# Patient Record
Sex: Female | Born: 1940 | Race: White | Hispanic: No | Marital: Married | Smoking: Former smoker
Health system: Southern US, Community
[De-identification: ages and names within clinical notes are randomized; demographics above are authoritative.]

---

## 2013-07-24 ENCOUNTER — Ambulatory Visit: Payer: Self-pay | Admitting: Family Medicine

## 2014-05-31 ENCOUNTER — Ambulatory Visit: Payer: Self-pay | Admitting: Family Medicine

## 2014-08-07 ENCOUNTER — Ambulatory Visit: Payer: Self-pay | Admitting: Family Medicine

## 2014-09-23 ENCOUNTER — Telehealth: Payer: Self-pay | Admitting: Physical Therapy

## 2014-09-23 ENCOUNTER — Ambulatory Visit: Payer: Medicare Other | Attending: Family Medicine | Admitting: Physical Therapy

## 2014-09-23 DIAGNOSIS — Z9181 History of falling: Secondary | ICD-10-CM | POA: Insufficient documentation

## 2014-09-23 DIAGNOSIS — R2689 Other abnormalities of gait and mobility: Secondary | ICD-10-CM | POA: Insufficient documentation

## 2014-09-23 DIAGNOSIS — R296 Repeated falls: Secondary | ICD-10-CM

## 2014-09-23 NOTE — Therapy (Signed)
[place] MAIN [facility] SERVICES [address] [place], [place], [address] Phone: [phone]   Fax:  [phone]  Physical Therapy Evaluation  Patient Details  Name: April Rivera MRN: [id number] Date of Birth: 1941/04/09 Referring Provider:  No ref. provider found  Encounter Date: 09/23/2014      PT End of Session - 09/23/14 1237    Visit Number 1   Number of Visits 16   Date for PT Re-Evaluation 11/26/14   PT Start Time 1100   PT Stop Time 1210   PT Time Calculation (min) 70 min   Equipment Utilized During Treatment Gait belt   Activity Tolerance Patient tolerated treatment well;Patient limited by fatigue   Behavior During Therapy WFL for tasks assessed/performed      No past medical history on file.  No past surgical history on file.  There were no vitals filed for this visit.  Visit Diagnosis:  Multiple falls  Imbalance      Subjective Assessment - 09/23/14 1111    Subjective Patient reports a fall in January resulting in an L3 fracture and retroperitoneal bleed (she was hospitalized for 2 weeks). 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 She has been ambulating at home without an AD, but feels weak and has had multiple falls in the past year.    Patient is accompained by: Family member   Limitations Walking;Standing;Other (comment)  Trouble getting out of low chairs.    How long can you sit comfortably? Patient has pain all the time in her legs, which has been increasing in her feet recently.    Patient Stated Goals Patient would like to stop falling, ambulate further distances, decrease her pain, and increase her LE strength.    Currently in Pain? Yes   Pain Score 5    Pain Location Leg   Pain Orientation Left;Right   Pain Descriptors / Indicators Aching   Pain Type Chronic pain   Pain Radiating Towards Has worked it's way down the leg, and is  now concentrated in the feet.    Pain Onset More than a month ago   Pain Frequency Constant   Aggravating Factors  Increased mobility.    Pain Relieving Factors Tylenol, heating pad for the legs.    Effect of Pain on Daily Activities Decreased her ability to be independent in the community.             [facility] PT Assessment - 09/23/14 0001    Assessment   Medical Diagnosis History of falls   Precautions   Precautions ICD/Pacemaker   Restrictions   Weight Bearing Restrictions No   Balance Screen   Has the patient fallen in the past 6 months Yes   How many times? 5   Has the patient had a decrease in activity level because of a fear of falling?  Yes   Is the patient reluctant to leave their home because of a fear of falling?  Yes   Home Environment   Living Enviornment Private residence   Living Arrangements Spouse/significant other   Available Help at Discharge Family   Type of Home House   Home Access Stairs to enter   Entrance Stairs-Number of Steps 3   Entrance Stairs-Rails Can reach both   Home Layout One level   Home Equipment Walker - 2 wheels   Sensation   Light Touch Appears Intact   Sit to Stand  Comments --  5x sit to stand in 52.5 seconds   ROM / Strength   AROM / PROM / Strength Strength   Strength   Overall Strength Deficits   Overall Strength Comments LE hip abductor 4/5, hip flexors 4-/5, knee extensors and flexors 5/5   Palpation   Palpation --  Tender to palpation around plantar surface of calcaneus   Ambulation/Gait   Ambulation/Gait Assistance 5: Supervision   Assistive device None   Ambulation Surface Level   Gait velocity 0.52 m/s   Stairs Yes   Stairs Assistance 5: Supervision   Stairs Assistance Details (indicate cue type and reason) Rails and step over step   Number of Stairs 4   Berg Balance Test   Sit to Stand Able to stand  independently using hands   Standing Unsupported Able to stand safely 2 minutes   Sitting with Back Unsupported  but Feet Supported on Floor or Stool Able to sit safely and securely 2 minutes   Stand to Sit Controls descent by using hands   Transfers Able to transfer safely, definite need of hands   Standing Unsupported with Eyes Closed Able to stand 10 seconds with supervision   Standing Ubsupported with Feet Together Able to place feet together independently and stand for 1 minute with supervision   From Standing, Reach Forward with Outstretched Arm Can reach forward >12 cm safely (5")   From Standing Position, Pick up Object from Floor Able to pick up shoe, needs supervision   From Standing Position, Turn to Look Behind Over each Shoulder Looks behind one side only/other side shows less weight shift   Turn 360 Degrees Able to turn 360 degrees safely but slowly   Standing Unsupported, Alternately Place Feet on Step/Stool Able to stand independently and safely and complete 8 steps in 20 seconds   Standing Unsupported, One Foot in [employer] Loses balance while stepping or standing   Standing on One Leg Tries to lift leg/unable to hold 3 seconds but remains standing independently   Total Score 39   Timed Up and Go Test   Normal TUG (seconds) 28.6   Functional Gait  Assessment   Gait Level Surface Walks 20 ft in less than 7 sec but greater than 5.5 sec, uses assistive device, slower speed, mild gait deviations, or deviates 6-10 in outside of the 12 in walkway width.   Change in Gait Speed Able to change speed, demonstrates mild gait deviations, deviates 6-10 in outside of the 12 in walkway width, or no gait deviations, unable to achieve a major change in velocity, or uses a change in velocity, or uses an assistive device.   Gait with Horizontal Head Turns Performs head turns smoothly with slight change in gait velocity (eg, minor disruption to smooth gait path), deviates 6-10 in outside 12 in walkway width, or uses an assistive device.   Gait with Vertical Head Turns Performs task with moderate change in gait  velocity, slows down, deviates 10-15 in outside 12 in walkway width but recovers, can continue to walk.   Gait and Pivot Turn Turns slowly, requires verbal cueing, or requires several small steps to catch balance following turn and stop   Step Over Obstacle Is able to step over one shoe box (4.5 in total height) but must slow down and adjust steps to clear box safely. May require verbal cueing.   Gait with Narrow Base of Support Ambulates less than 4 steps heel to toe or cannot perform without assistance.  Gait with Eyes Closed Walks 20 ft, slow speed, abnormal gait pattern, evidence for imbalance, deviates 10-15 in outside 12 in walkway width. Requires more than 9 sec to ambulate 20 ft.   Ambulating Backwards Walks 20 ft, slow speed, abnormal gait pattern, evidence for imbalance, deviates 10-15 in outside 12 in walkway width.   Steps Alternating feet, must use rail.   Total Score 13                           PT Education - 09/23/14 1236    Education provided Yes   Education Details Home exercise program, educated patient on need to use a walker when not supervised in the home for now, educated pt on use of shoes in the home when not supervised.    Person(s) Educated Patient;Spouse   Methods Explanation;Demonstration;Verbal cues;Handout   Comprehension Verbalized understanding;Returned demonstration             PT Long Term Goals - 09/23/14 1305    PT LONG TERM GOAL #1   Title Patient will increase Berg Balance score by >6 points to demonstrate decreased risk of falling during dynamic functional activities by 11/26/2014   Status New   PT LONG TERM GOAL #2   Title Patient will be independent with a home exercise program to improve strength, power, and balance to improve safety and tolerance for dynamic mobility by 11/26/2014.    Status New   PT LONG TERM GOAL #3   Title Patient will complete TUG in under 23 seconds to demonstrate decreased risk of falling during  dynamic gait activities by 11/26/2014.    Status New   PT LONG TERM GOAL #4   Title Patient will complete 5 time sit to stand with use of hand rails in under 40 seconds to demonstrate decreased risk of falling during dynamic mobility activities by 11/26/2014.   PT LONG TERM GOAL #5   Title Patient will increase FGA score by at least 4 points to demonstrate increased safety with ambulation and decreased risk of falls by 11/26/2014.   Additional Long Term Goals   Additional Long Term Goals Yes   PT LONG TERM GOAL #6   Title Patient will increase independent gait speed by at least 0.25 m/s to demonstrate decreased falls risk and improved safety with community mobility by 11/26/2014.   Status New   PT LONG TERM GOAL #7   Title Patient will demonstrate 5/5 LE MMT strength to display increased power and strength for functional mobility by 11/26/2014.               Plan - 09/23/14 1241    Clinical Impression Statement Pt is a 74 y/o female that has a history of multiple falls in the last year, one resulting in an L3 vertebral fracture this past January. Pt presents today after completing home health PT, with decreased functional strength, significant dynamic balance deficits, and decreased tolerance for intermediate and community distance ambulation. Pt is unable to balance on a single leg, in tandem stance, or safely complete turns at this time secondary to LE weakness. Pt would benefit from skilled physical therapy services to address her significant balance deficits.    Pt will benefit from skilled therapeutic intervention in order to improve on the following deficits Difficulty walking;Abnormal gait;Decreased activity tolerance;Pain;Decreased balance;Decreased mobility;Decreased strength   Rehab Potential Good   Clinical Impairments Affecting Rehab Potential Deconditioned status, chronic pain state.    PT Frequency  2x / week   PT Duration 8 weeks   PT Treatment/Interventions ADLs/Self Care Home  Management;Gait training;Neuromuscular re-education;Stair training;Cryotherapy;Moist Heat;DME Instruction;Balance training;Manual techniques;Therapeutic exercise   PT Next Visit Plan Assess tolerance of HEP, progress HEP as tolerated, and begin strengthening, gait, and balance improvement activities and exercises.    PT Home Exercise Plan Provided as above in patient instructions.    Consulted and Agree with Plan of Care Patient;Family member/caregiver   Family Member Consulted Husband           G-Codes - 09/23/14 1239    Functional Assessment Tool Used TUG, 5x sit to stand, FGA, Berg   Functional Limitation Mobility: Walking and moving around   Mobility: Walking and Moving Around Current Status [phone]([id number]) At least 40 percent but less than 60 percent impaired, limited or restricted   Mobility: Walking and Moving Around Goal Status [phone](G8979) At least 20 percent but less than 40 percent impaired, limited or restricted       Problem List There are no active problems to display for this patient.   Kerin RansomPatrick A McNamara, PT, DPT    [place] [facility] MAIN [facility] [address] [place], [place], [address] Phone: [phone]   Fax:  [phone]

## 2014-09-23 NOTE — Telephone Encounter (Signed)
Pt called regarding no show status today. Please see above comments for further details.

## 2014-09-23 NOTE — Patient Instructions (Signed)
Band Walk: Side Stepping   Tie band around legs, just above knees. Step _8__ feet to one side, then step back to start. Repeat _2 times each way__ Achilles / Gastroc, Standing   Stand, right foot behind, heel on floor and turned slightly out, leg straight, forward leg bent. Move hips forward. Hold 3___ seconds. Repeat _10__ times per session. Do __2_ sessions per day.  Copyright  VHI. All rights reserved.   per session. Note: Small towel between band and skin eases rubbing. ** CAN DO THIS HOLDING ON TO COUNTER TOP, JUST LEAN WEIGHT FORWARD TO THE FRONT LEGABDUCTION: Standing (Active)   Stand, feet flat. Lift right leg out to side. Use ___ lbs. Complete __2_ sets of __10_ repetitions. Perform __1_ sessions per day. ** HAVE A COUNTER TOP IN FRONT OF YOU TO HOLD ON WITH BOTH HANDS  http://gtsc.exer.us/111   Copyright  VHI. All rights reserved.    http://plyo.exer.us/76   Copyright  VHI. All rights reserved.

## 2014-09-28 ENCOUNTER — Ambulatory Visit: Payer: Medicare Other | Admitting: Physical Therapy

## 2014-09-28 DIAGNOSIS — R2689 Other abnormalities of gait and mobility: Secondary | ICD-10-CM

## 2014-09-28 DIAGNOSIS — R296 Repeated falls: Secondary | ICD-10-CM

## 2014-09-28 NOTE — Therapy (Signed)
[facility] [address] [place], [place], [address] Phone: [phone]   Fax:  [phone]  Physical Therapy Treatment  Patient Details  Name: April RipperKathleen Ann Rivera MRN: [id number] Date of Birth: [birth year] Referring Provider:  No ref. provider found  Encounter Date: 09/28/2014      PT End of Session - 09/28/14 1359    Visit Number 2   Number of Visits 16   Date for PT Re-Evaluation 11/26/14   PT Start Time 1304   PT Stop Time 1331   PT Time Calculation (min) 27 min   Equipment Utilized During Treatment Gait belt;Other (comment)  Walker    Activity Tolerance Patient tolerated treatment well;Patient limited by fatigue   Behavior [facility] Therapy [facility] for tasks assessed/performed      No past medical history on file.  No past surgical history on file.  There were no vitals filed for this visit.  Visit Diagnosis:  Multiple falls  Imbalance      Subjective Assessment - 09/28/14 1351    Subjective Patient reports that she fell in the bathroom on Friday morning and has had foot pain, localized to the heel since then. Patient states [place] [facility] [place] [place] [facility] [place] [place] [facility] of her RLE.    Patient is accompained by: Family member   Limitations Walking;Standing;Other (comment)   How long can you sit comfortably? Patient reports she has had increased pain in her right foot at the heel since her fall in the bathroom on Friday.    Diagnostic tests Patient is seeing Dr. Cay SchillingsJack Wolf after this session for further assessment of her RLE.    Patient Stated Goals Patient would like to stop falling, ambulate further distances, decrease her pain, and increase her LE strength.    Currently in Pain? Other (Comment)  Patient does not rate pain, but indicates she has pain in her right  heel.      Self Care Patient educated that she needs an assistive device for all out of bed mobility if she is  considered FWB after her medical evaluation later today. Until that time patient was educated to maintain NWB/TTWB status on her RLE. Patient educated on transfer from wheel chair to mat with wheeled walker and TTWB on RLE, using hopping strategy for mobility.  Eversion tested - mild increase in pain Inversion - painful  On RLE  PF - mild increase in pain DF- mild increase in pain.  Plantar fascia stretch applied with palpation, did not provoke symptoms.  Point tenderness over right calcaneus on plantar surface. Grade I-II mobilizations applied to 1-5 metatarsals, pain provoked with 3rd metatarsal testing.   There-Ex LAQ x 15 with BW, x 15 with 1.5# ankle weight bilaterally  SLRs x 10 bilaterally with 3# ankle weight bilaterally (difficult for her to complete secondary to weakness)  Heel slides x 15 bilaterally  Patient fatigued quickly today, and PT opted to cut session short today secondary to concern for fracture.                            PT Education - 09/28/14 1357    Education provided Yes   Education Details Patient educated in non-weight bearing exercises. Patient instructed that PT is concerned for possible fracture post fall, and that she should maintain TTWB/NWB status on RLE until further evaluation.    Person(s) Educated Patient;Spouse   Methods Explanation;Demonstration  Comprehension Verbalized understanding;Returned demonstration             PT Long Term Goals - 09/28/14 1402    PT LONG TERM GOAL #1   Title Patient will increase Berg Balance score by >6 points to demonstrate decreased risk of falling during dynamic functional activities by 11/26/2014   Status New   PT LONG TERM GOAL #2   Title Patient will be independent with a home exercise program to improve strength, power, and balance to improve safety and tolerance for dynamic mobility by 11/26/2014.    Status New   PT LONG TERM GOAL #3   Title Patient will complete TUG in under 23  seconds to demonstrate decreased risk of falling during dynamic gait activities by 11/26/2014.    Status New   PT LONG TERM GOAL #4   Title Patient will complete 5 time sit to stand with use of hand rails in under 40 seconds to demonstrate decreased risk of falling during dynamic mobility activities by 11/26/2014.   PT LONG TERM GOAL #5   Title Patient will increase FGA score by at least 4 points to demonstrate increased safety with ambulation and decreased risk of falls by 11/26/2014.   PT LONG TERM GOAL #6   Title Patient will increase independent gait speed by at least 0.25 m/s to demonstrate decreased falls risk and improved safety with community mobility by 11/26/2014.   Status New   PT LONG TERM GOAL #7   Title Patient will demonstrate 5/5 LE MMT strength to display increased power and strength for functional mobility by 11/26/2014.               Plan - 09/28/14 1400    Clinical Impression Statement Patient reports a fall last Friday in her bathroom and has had increased tenderness over her right heel since that time. At this time, it is difficult to ascertain what the severity of this injury is as well as any responsible anatomic structures. PT tested  plantat fascia, which did not seem to provoke her symptoms. PT provided hand written note to patient informing physician of concerns of possible fracture. If patient is cleared for full weight bearing, patient would benefit from skilled PT services to improve her dynamic balance and safety with mobility.   Pt will benefit from skilled therapeutic intervention in order to improve on the following deficits Difficulty walking;Abnormal gait;Decreased activity tolerance;Pain;Decreased balance;Decreased mobility;Decreased strength   Rehab Potential Good   Clinical Impairments Affecting Rehab Potential Deconditioned status, chronic pain state.    PT Frequency 2x / week   PT Duration 8 weeks   PT Treatment/Interventions ADLs/Self Care Home  Management;Gait training;Neuromuscular re-education;Stair training;Cryotherapy;Moist Heat;DME Instruction;Balance training;Manual techniques;Therapeutic exercise   PT Next Visit Plan Await results of imaging studies and updates on weight bearing status. If patient is full weight bearing patient will be treated with manual techniques to minimize her RLE pain, and progressed with weight bearing LE activities.    PT Home Exercise Plan Patient instructed today in non-weight bearing activities, instructed to maintain NWB/TTWB status on RLE until a weight bearing status is confirmed.    Recommended Other Services To be determined after further assessment of RLE.    Consulted and Agree with Plan of Care Patient;Family member/caregiver   Family Member Consulted Husband         Problem List There are no active problems to display for this patient.  Kerin RansomPatrick A Mandy Peeks, PT, DPT    09/28/2014, 2:16 PM  [facility]  [place] MAIN [facility] SERVICES [address] [place], [place], [address] Phone: [phone]   Fax:  [phone]

## 2014-09-28 NOTE — Patient Instructions (Signed)
Patient instructed to maintain TTWB/NWB status on RLE until further assessment today with Dr. Sheppard PentonWolf.

## 2014-09-30 ENCOUNTER — Encounter: Payer: Medicare Other | Admitting: Physical Therapy

## 2014-10-05 ENCOUNTER — Ambulatory Visit: Payer: Medicare Other | Admitting: Physical Therapy

## 2014-10-07 ENCOUNTER — Ambulatory Visit: Payer: Medicare Other | Admitting: Physical Therapy

## 2014-10-11 ENCOUNTER — Ambulatory Visit: Payer: Medicare Other | Admitting: Physical Therapy

## 2014-11-01 ENCOUNTER — Encounter: Payer: Self-pay | Admitting: Physical Therapy

## 2014-11-01 ENCOUNTER — Ambulatory Visit: Payer: Medicare Other | Attending: Family Medicine | Admitting: Physical Therapy

## 2014-11-01 DIAGNOSIS — R296 Repeated falls: Secondary | ICD-10-CM | POA: Insufficient documentation

## 2014-11-01 DIAGNOSIS — R2689 Other abnormalities of gait and mobility: Secondary | ICD-10-CM | POA: Diagnosis not present

## 2014-11-01 NOTE — Therapy (Signed)
[place] [facility] MAIN [facility] SERVICES [address] [place], [place], [address] Phone: [phone]   Fax:  [phone]  Physical Therapy Treatment  Patient Details  Name: April Rivera MRN: [id number] Date of Birth: 04-18-1941 Referring Provider:  Berneice Gandy, MD  Encounter Date: 11/01/2014      PT End of Session - 11/01/14 0919    Visit Number 3   Number of Visits 16   Date for PT Re-Evaluation 11/26/14   PT Start Time 0910   PT Stop Time 0950   PT Time Calculation (min) 40 min   Equipment Utilized During Treatment Gait belt;Other (comment)   Activity Tolerance Patient tolerated treatment well;Patient limited by fatigue   Behavior During Therapy [facility] for tasks assessed/performed      History reviewed. No pertinent past medical history.  History reviewed. No pertinent past surgical history.  There were no vitals filed for this visit.  Visit Diagnosis:  Multiple falls  Imbalance      Subjective Assessment - 11/01/14 0916    Subjective Patient has been to the MD and now has diagnosis of stress frature of right ankle.    Patient is accompained by: Family member   Limitations Walking;Standing;Other (comment)   Patient Stated Goals Patient wants to improve her balance.   Currently in Pain? No/denies      Outcome measures taken:    5 x sit to stand  20.07 sec      10 MW  71 sec   TUG n 14.48 sec      6 MW  680 feet Reviewed HEP for balance and initial strengthening exercises to be progressed at next visit. Standing in corner with feet together and head turns left and right with chair in front x 5 minutes slowly moving her foot fwd as she getts better balance Sit to stand x 10  Standing 4 way with YTB x 10 BLE Seated YTB hip abd/ER x 30                          PT Education - 11/01/14 0918    Education provided Yes   Education Details Reviewed HEP.    Person(s) Educated Patient;Spouse   Methods  Demonstration;Explanation   Comprehension Verbalized understanding;Returned demonstration             PT Long Term Goals - 09/28/14 1402    PT LONG TERM GOAL #1   Title Patient will increase Berg Balance score by >6 points to demonstrate decreased risk of falling during dynamic functional activities by 11/26/2014   Status New   PT LONG TERM GOAL #2   Title Patient will be independent with a home exercise program to improve strength, power, and balance to improve safety and tolerance for dynamic mobility by 11/26/2014.    Status New   PT LONG TERM GOAL #3   Title Patient will complete TUG in under 23 seconds to demonstrate decreased risk of falling during dynamic gait activities by 11/26/2014.    Status New   PT LONG TERM GOAL #4   Title Patient will complete 5 time sit to stand with use of hand rails in under 40 seconds to demonstrate decreased risk of falling during dynamic mobility activities by 11/26/2014.   PT LONG TERM GOAL #5   Title Patient will increase FGA score by at least 4 points to demonstrate increased safety with ambulation and decreased risk of falls by 11/26/2014.  PT LONG TERM GOAL #6   Title Patient will increase independent gait speed by at least 0.25 m/s to demonstrate decreased falls risk and improved safety with community mobility by 11/26/2014.   Status New   PT LONG TERM GOAL #7   Title Patient will demonstrate 5/5 LE MMT strength to display increased power and strength for functional mobility by 11/26/2014.               Plan - 11/01/14 1610    Clinical Impression Statement Patient reports no pain today in her right ankle but has weakness and impaired dynamic stanidng balance. Pt had good performance of therapeutic exercise and standing balance today. Patient will continue to benefit from skilled PT to improve mobility and decrease falls risk   Pt will benefit from skilled therapeutic intervention in order to improve on the following deficits Difficulty  walking;Abnormal gait;Decreased activity tolerance;Pain;Decreased balance;Decreased mobility;Decreased strength   Rehab Potential Good   Clinical Impairments Affecting Rehab Potential Deconditioned status, chronic pain state.    PT Frequency 2x / week   PT Duration 8 weeks   PT Treatment/Interventions ADLs/Self Care Home Management;Gait training;Neuromuscular re-education;Stair training;Cryotherapy;Moist Heat;DME Instruction;Balance training;Manual techniques;Therapeutic exercise   PT Next Visit Plan strengthening and balance    PT Home Exercise Plan standing balance exercises and initial strengthening exercises.    Family Member Consulted Husband         Problem List There are no active problems to display for this patient.   Ezekiel Ina 11/01/2014, 9:26 AM  [place] [facility] MAIN [facility] SERVICES [address] [place], [place], [address] Phone: [phone]   Fax:  [phone]

## 2014-11-03 ENCOUNTER — Encounter: Payer: Self-pay | Admitting: Physical Therapy

## 2014-11-03 ENCOUNTER — Ambulatory Visit: Payer: Medicare Other | Admitting: Physical Therapy

## 2014-11-03 DIAGNOSIS — R296 Repeated falls: Secondary | ICD-10-CM | POA: Diagnosis not present

## 2014-11-03 DIAGNOSIS — R2689 Other abnormalities of gait and mobility: Secondary | ICD-10-CM

## 2014-11-03 NOTE — Patient Instructions (Addendum)
All exercises provided were adapted from [email or web address]. Patient was provided a written handout with pictures as described. Any additional cues were manually entered in to handout and copied in to this document.   SIT TO STAND - NO HANDS  Start by sitting in a chair. Next, raise up to standing without using your hands for support.  Calf Raises  While standing behind a chair, go up onto your toes. Get a good squeeze in your calf muscles, and then lower back down to the floor.   Supine bridge  Lie down on your back and bend your knees so that your feet are flat on the table. Press your heels into the ground to lift your hips off of the table. You should stabilize through your core. Return to the starting position controlling your speed.

## 2014-11-03 NOTE — Therapy (Signed)
[place] [facility] MAIN [facility] SERVICES [address] [place], [place], [address] Phone: [phone]   Fax:  [phone]  Physical Therapy Treatment  Patient Details  Name: April Rivera MRN: [id number] Date of Birth: Jul 18, 1940 Referring Provider:  Berneice Gandy, MD  Encounter Date: 11/03/2014      PT End of Session - 11/03/14 0952    Visit Number 4   Number of Visits 16   Date for PT Re-Evaluation 11/26/14   PT Start Time 0900   PT Stop Time 0948   PT Time Calculation (min) 48 min   Equipment Utilized During Treatment Gait belt   Activity Tolerance Patient tolerated treatment well;Patient limited by fatigue   Behavior During Therapy [facility] for tasks assessed/performed      History reviewed. No pertinent past medical history.  History reviewed. No pertinent past surgical history.  There were no vitals filed for this visit.  Visit Diagnosis:  Multiple falls  Imbalance      Subjective Assessment - 11/03/14 1236    Subjective Patient reports she has been ambulating in the house without a walker and has not had any recent falls. She has no limitations on her right foot aside from maintaining "low impact". She has continued performing her balance exercises at home.    Patient is accompained by: Family member   Limitations Walking;Standing   Patient Stated Goals Patient wants to improve her balance.   Currently in Pain? No/denies      There- Ex   Supine bridging 2 sets x 12 repetitions  Sit to stand training 2 sets x 10 repetitions  Heel Raises 2 sets x 15 repetitions  Neuromuscular Re-education  Tandem Stance balance 5 sets bilaterally x 7-15 seconds  Blue foam Rhomberg x 30 seconds  Blue foam Rhomberg with eyes closed x 30 seconds Forwards and retro hip flexion walking x 3 repetitions in // bars x 10'  Toe tapping to cones x 1 minute with one touch or two touches. Called out color and foot (such as left cone, left foot) for  dual tasking.  Patient required prolonged and frequent rest breaks throughout session secondary to fatigue.                          PT Education - 11/03/14 0951    Education provided Yes   Education Details Updated HEP and educated patient on need to strengthen her LEs to increase her balance.    Person(s) Educated Patient;Spouse   Methods Explanation;Demonstration;Handout   Comprehension Verbalized understanding;Returned demonstration             PT Long Term Goals - 09/28/14 1402    PT LONG TERM GOAL #1   Title Patient will increase Berg Balance score by >6 points to demonstrate decreased risk of falling during dynamic functional activities by 11/26/2014   Status New   PT LONG TERM GOAL #2   Title Patient will be independent with a home exercise program to improve strength, power, and balance to improve safety and tolerance for dynamic mobility by 11/26/2014.    Status New   PT LONG TERM GOAL #3   Title Patient will complete TUG in under 23 seconds to demonstrate decreased risk of falling during dynamic gait activities by 11/26/2014.    Status New   PT LONG TERM GOAL #4   Title Patient will complete 5 time sit to stand with use of hand rails in under 40  seconds to demonstrate decreased risk of falling during dynamic mobility activities by 11/26/2014.   PT LONG TERM GOAL #5   Title Patient will increase FGA score by at least 4 points to demonstrate increased safety with ambulation and decreased risk of falls by 11/26/2014.   PT LONG TERM GOAL #6   Title Patient will increase independent gait speed by at least 0.25 m/s to demonstrate decreased falls risk and improved safety with community mobility by 11/26/2014.   Status New   PT LONG TERM GOAL #7   Title Patient will demonstrate 5/5 LE MMT strength to display increased power and strength for functional mobility by 11/26/2014.               Plan - 11/03/14 0954    Clinical Impression Statement Patient does not  complain of any pain during the session today and tolerates all treatment well, though she is limited by fatigue. Patient demonstrates improved tandem stance and single leg balance with balance activities today, though she continues to demonstrate LE weakness and poor recovery from lateral swaying/loss of balance. Patient would continue to benefit from skilled PT to improve her independence and safety with mobility.    Pt will benefit from skilled therapeutic intervention in order to improve on the following deficits Difficulty walking;Abnormal gait;Decreased activity tolerance;Pain;Decreased balance;Decreased mobility;Decreased strength   Rehab Potential Good   Clinical Impairments Affecting Rehab Potential Deconditioned status, chronic pain state.    PT Frequency 2x / week   PT Duration 8 weeks   PT Treatment/Interventions ADLs/Self Care Home Management;Gait training;Neuromuscular re-education;Stair training;Cryotherapy;Moist Heat;DME Instruction;Balance training;Manual techniques;Therapeutic exercise   PT Next Visit Plan Continue with LE strengthening, endurance training, and narrow BOS balance activities.    PT Home Exercise Plan See patient instructions.    Consulted and Agree with Plan of Care Patient;Family member/caregiver   Family Member Consulted Husband         Problem List There are no active problems to display for this patient.  Kerin Ransom, PT, DPT   11/03/2014, 12:38 PM  [place] [facility] MAIN [facility] SERVICES [address] [place], [place], [address] Phone: [phone]   Fax:  [phone]

## 2014-11-08 ENCOUNTER — Ambulatory Visit: Payer: Medicare Other | Admitting: Physical Therapy

## 2014-11-08 ENCOUNTER — Encounter: Payer: Self-pay | Admitting: Physical Therapy

## 2014-11-08 DIAGNOSIS — R296 Repeated falls: Secondary | ICD-10-CM | POA: Diagnosis not present

## 2014-11-08 DIAGNOSIS — R2689 Other abnormalities of gait and mobility: Secondary | ICD-10-CM

## 2014-11-08 NOTE — Therapy (Signed)
[place] [facility] MAIN [facility] SERVICES [address] [place], [place], [address] Phone: [phone]   Fax:  [phone]  Physical Therapy Treatment  Patient Details  Name: April Rivera MRN: [id number] Date of Birth: May 16, 1941 Referring Provider:  Berneice Gandy, MD  Encounter Date: 11/08/2014      PT End of Session - 11/08/14 0940    Visit Number 5   Number of Visits 16   Date for PT Re-Evaluation 11/26/14   PT Start Time 0900   PT Stop Time 0945   PT Time Calculation (min) 45 min   Equipment Utilized During Treatment Gait belt   Activity Tolerance Patient tolerated treatment well;Patient limited by fatigue   Behavior During Therapy [facility] for tasks assessed/performed      History reviewed. No pertinent past medical history.  History reviewed. No pertinent past surgical history.  There were no vitals filed for this visit.  Visit Diagnosis:  Multiple falls  Imbalance      Subjective Assessment - 11/08/14 0937    Subjective Patient is doing her HEP and continues to have unsteady balance and she fell in the bathroom at a restaurant.  Patients right foot is doing better.    Patient is accompained by: Family member   Limitations Walking;Standing   Currently in Pain? No/denies   Pain Score 0-No pain      Standing in corner with feet together and head turns left and right with chair in front x 5 minutes slowly moving her foot fwd as she getts better balance Sit to stand x 10  Standing 4 way with YTB x 10 BLE Seated YTB hip abd/ER x 30  Standing squats x 20 Leg press x 20 x 2 Patient needs CGA and min assist for all balance training with multiple loss of balance.                            PT Education - 11/08/14 0940    Education provided Yes   Education Details Progressed HEP and reviewed balance exercises.    Person(s) Educated Spouse   Methods Explanation;Demonstration   Comprehension Verbalized  understanding;Returned demonstration             PT Long Term Goals - 09/28/14 1402    PT LONG TERM GOAL #1   Title Patient will increase Berg Balance score by >6 points to demonstrate decreased risk of falling during dynamic functional activities by 11/26/2014   Status New   PT LONG TERM GOAL #2   Title Patient will be independent with a home exercise program to improve strength, power, and balance to improve safety and tolerance for dynamic mobility by 11/26/2014.    Status New   PT LONG TERM GOAL #3   Title Patient will complete TUG in under 23 seconds to demonstrate decreased risk of falling during dynamic gait activities by 11/26/2014.    Status New   PT LONG TERM GOAL #4   Title Patient will complete 5 time sit to stand with use of hand rails in under 40 seconds to demonstrate decreased risk of falling during dynamic mobility activities by 11/26/2014.   PT LONG TERM GOAL #5   Title Patient will increase FGA score by at least 4 points to demonstrate increased safety with ambulation and decreased risk of falls by 11/26/2014.   PT LONG TERM GOAL #6   Title Patient will increase independent gait speed by at least  0.25 m/s to demonstrate decreased falls risk and improved safety with community mobility by 11/26/2014.   Status New   PT LONG TERM GOAL #7   Title Patient will demonstrate 5/5 LE MMT strength to display increased power and strength for functional mobility by 11/26/2014.               Plan - 11/08/14 0941    Clinical Impression Statement Patient reports no pain during exercises and balance training. She will continue to benefit from skilled PT to improve balance and decrease her falls risk.    Pt will benefit from skilled therapeutic intervention in order to improve on the following deficits Difficulty walking;Abnormal gait;Decreased activity tolerance;Pain;Decreased balance;Decreased mobility;Decreased strength   Rehab Potential Good   Clinical Impairments Affecting Rehab  Potential Deconditioned status, chronic pain state.    PT Frequency 2x / week   PT Duration 8 weeks   PT Treatment/Interventions ADLs/Self Care Home Management;Gait training;Neuromuscular re-education;Stair training;Cryotherapy;Moist Heat;DME Instruction;Balance training;Manual techniques;Therapeutic exercise   PT Next Visit Plan Continue with LE strengthening, endurance training, and narrow BOS balance activities.    PT Home Exercise Plan See patient instructions.    Consulted and Agree with Plan of Care Patient;Family member/caregiver   Family Member Consulted Husband         Problem List There are no active problems to display for this patient.   Ezekiel Ina 11/08/2014, 9:44 AM  [place] [facility] MAIN [facility] SERVICES [address] [place], [place], [address] Phone: [phone]   Fax:  [phone]

## 2014-11-10 ENCOUNTER — Ambulatory Visit: Payer: Medicare Other | Admitting: Physical Therapy

## 2014-11-10 ENCOUNTER — Encounter: Payer: Self-pay | Admitting: Physical Therapy

## 2014-11-10 DIAGNOSIS — R296 Repeated falls: Secondary | ICD-10-CM

## 2014-11-10 DIAGNOSIS — R2689 Other abnormalities of gait and mobility: Secondary | ICD-10-CM

## 2014-11-10 NOTE — Therapy (Signed)
[place] [facility] MAIN [facility] SERVICES [address] [place], [place], [address] Phone: [phone]   Fax:  [phone]  Physical Therapy Treatment  Patient Details  Name: April Rivera MRN: [id number] Date of Birth: [birth year] Referring Provider:  Berneice Gandy, MD  Encounter Date: 11/10/2014      PT End of Session - 11/10/14 1005    Visit Number 6   Number of Visits 16   Date for PT Re-Evaluation 11/26/14   PT Start Time 0906   PT Stop Time 0947   PT Time Calculation (min) 41 min   Equipment Utilized During Treatment Gait belt   Activity Tolerance Patient tolerated treatment well   Behavior During Therapy [facility] for tasks assessed/performed      History reviewed. No pertinent past medical history.  History reviewed. No pertinent past surgical history.  There were no vitals filed for this visit.  Visit Diagnosis:  Multiple falls  Imbalance      Subjective Assessment - 11/10/14 0906    Patient is accompained by: Family member   Limitations Walking;Standing   Patient Stated Goals Patient wants to improve her balance.   Currently in Pain? No/denies        NuStep Level 3 x3 minutes (unbilled)    There-Ex Sit to stand x 4 with arm rails, cuing for as fast as she can go. 4 sets of this, moderate to heavy use of hand rails.  Leg Press 75# x 10 repetitions for 2 sets  Standing Heel raises x 15 with unilateral hand support x 2 sets  Step Ups to blue foam pad x 10 repetitions bilaterally    Neuromuscular Re-education  Rhomberg stance on blue foam pad with eyes open x 30 seconds for 2 sets  Stepping on blue foam pad x 30 seconds for 2 sets without hand hold assistance  Dynamic postural balance with rhythmic stabilization 2 sets x 30 seconds (pushing patient forwards, backwards, and laterally)  Catching foam balls on blue foam pad x 30 seconds in Rhomberg stance for 2 sets.                           PT  Education - 11/10/14 1005    Education provided Yes   Education Details Educated patient on balance systems, progression of strength training, and cuing for quickly performing sit to stands.    Person(s) Educated Patient;Spouse   Methods Explanation;Demonstration;Tactile cues   Comprehension Verbalized understanding;Returned demonstration             PT Long Term Goals - 11/10/14 1006    PT LONG TERM GOAL #1   Title Patient will increase Berg Balance score by >6 points to demonstrate decreased risk of falling during dynamic functional activities by 11/26/2014   Status On-going   PT LONG TERM GOAL #2   Title Patient will be independent with a home exercise program to improve strength, power, and balance to improve safety and tolerance for dynamic mobility by 11/26/2014.    Status Achieved   PT LONG TERM GOAL #3   Title Patient will complete TUG in under 23 seconds to demonstrate decreased risk of falling during dynamic gait activities by 11/26/2014.    Status On-going   PT LONG TERM GOAL #4   Title Patient will complete 5 time sit to stand with use of hand rails in under 40 seconds to demonstrate decreased risk of falling during dynamic mobility activities by 11/26/2014.  Status On-going   PT LONG TERM GOAL #5   Title Patient will increase FGA score by at least 4 points to demonstrate increased safety with ambulation and decreased risk of falls by 11/26/2014.   Status On-going   PT LONG TERM GOAL #6   Title Patient will increase independent gait speed by at least 0.25 m/s to demonstrate decreased falls risk and improved safety with community mobility by 11/26/2014.   Status On-going   PT LONG TERM GOAL #7   Title Patient will demonstrate 5/5 LE MMT strength to display increased power and strength for functional mobility by 11/26/2014.   Status On-going               Plan - 11/10/14 1008    Clinical Impression Statement Patient displays improving balance on uneven surfaces, but  continues to require UE assistance to provide vertical force for sit to stand training. Patient provided with power training today (cuing for moving as fast as she can), Patient was provided with multiple rest breaks throughout the session, but generally tolerates well. She was encouraged to use a cane in the bathroom, as she has now had 2 falls in the bathroom since her initial evaluation. Skilled PT services are indicated to continue to address her safety with mobility.    Pt will benefit from skilled therapeutic intervention in order to improve on the following deficits Difficulty walking;Abnormal gait;Decreased activity tolerance;Pain;Decreased balance;Decreased mobility;Decreased strength   Rehab Potential Good   Clinical Impairments Affecting Rehab Potential Deconditioned status, chronic pain state.    PT Frequency 2x / week   PT Duration 8 weeks   PT Treatment/Interventions ADLs/Self Care Home Management;Gait training;Neuromuscular re-education;Stair training;Cryotherapy;Moist Heat;DME Instruction;Balance training;Manual techniques;Therapeutic exercise   PT Next Visit Plan LE strengthening, narrow BOS, dynamic postural training, power training.    PT Home Exercise Plan Maintained from last session.    Consulted and Agree with Plan of Care Patient;Family member/caregiver   Family Member Consulted Husband         Problem List There are no active problems to display for this patient.  Kerin Ransom, PT, DPT   11/10/2014, 10:17 AM  [place] [facility] MAIN [facility] SERVICES [address] [place], [place], [address] Phone: [phone]   Fax:  [phone]

## 2014-11-15 ENCOUNTER — Encounter: Payer: Self-pay | Admitting: Physical Therapy

## 2014-11-15 ENCOUNTER — Ambulatory Visit: Payer: Medicare Other | Admitting: Physical Therapy

## 2014-11-15 DIAGNOSIS — R2689 Other abnormalities of gait and mobility: Secondary | ICD-10-CM

## 2014-11-15 DIAGNOSIS — R296 Repeated falls: Secondary | ICD-10-CM

## 2014-11-15 NOTE — Therapy (Signed)
[facility] [facility] [address] [place], [place], [address] Phone: [phone]   Fax:  [phone]  Physical Therapy Treatment  Patient Details  Name: April RipperKathleen Ann Mirante MRN: [id number] Date of Birth: January 23, 1941 Referring Provider:  Berneice GandyFowler, Vicki S, MD  Encounter Date: 11/15/2014      PT End of Session - 11/15/14 0911    Visit Number 7   Number of Visits 16   Date for PT Re-Evaluation 11/26/14   PT Start Time 0900   PT Stop Time 0945   PT Time Calculation (min) 45 min   Equipment Utilized During Treatment Gait belt   Activity Tolerance Patient tolerated treatment well   Behavior [facility] for tasks assessed/performed      History reviewed. No pertinent past medical history.  History reviewed. No pertinent past surgical history.  There were no vitals filed for this visit.  Visit Diagnosis:  Multiple falls  Imbalance      Subjective Assessment - 11/15/14 0909    Subjective Patient is doing her HEP.  She did [place] her [place].    Patient is accompained by: Family member   Limitations Standing   Currently in Pain? No/denies      Gait training with rollator with cuing for sequencing and locking and unlocking the rollator and for turning it around and sitting on it.  Stpe ups fwd, step ups sideways x 20 left and right Leg press x 20 x 3 75 lbs  Squats with therball on the wall and min assist x 10 Patient has multiple loss of balance with step ups and with wall squats                          PT Education - 11/15/14 0910    Education provided Yes   Education Details Reviewed HEP. Educated on ambulation with RW   Person(s) Educated Patient   Methods Explanation   Comprehension Verbalized understanding             PT Long Term Goals - 11/10/14 1006    PT LONG TERM GOAL #1   Title Patient will increase Berg Balance score by >6 points to  demonstrate decreased risk of falling during dynamic functional activities by 11/26/2014   Status On-going   PT LONG TERM GOAL #2   Title Patient will be independent with a home exercise program to improve strength, power, and balance to improve safety and tolerance for dynamic mobility by 11/26/2014.    Status Achieved   PT LONG TERM GOAL #3   Title Patient will complete TUG in under 23 seconds to demonstrate decreased risk of falling during dynamic gait activities by 11/26/2014.    Status On-going   PT LONG TERM GOAL #4   Title Patient will complete 5 time sit to stand with use of hand rails in under 40 seconds to demonstrate decreased risk of falling during dynamic mobility activities by 11/26/2014.   Status On-going   PT LONG TERM GOAL #5   Title Patient will increase FGA score by at least 4 points to demonstrate increased safety with ambulation and decreased risk of falls by 11/26/2014.   Status On-going   PT LONG TERM GOAL #6   Title Patient will increase independent gait speed by at least 0.25 m/s to demonstrate decreased falls risk and improved safety with community mobility by 11/26/2014.   Status On-going   PT  LONG TERM GOAL #7   Title Patient will demonstrate 5/5 LE MMT strength to display increased power and strength for functional mobility by 11/26/2014.   Status On-going               Plan - 11/15/14 0945    Clinical Impression Statement Patient continues to be unsteady on her feet and a rollator was recommended. Patinet will continue to benefit from skilled PT to improve strength and balance.        Problem List There are no active problems to display for this patient.   Ezekiel Ina 11/15/2014, 9:46 AM  [place] [facility] MAIN [facility] SERVICES [address] [place], [place], [address] Phone: [phone]   Fax:  [phone]

## 2014-11-17 ENCOUNTER — Ambulatory Visit: Payer: Medicare Other | Admitting: Physical Therapy

## 2014-11-17 ENCOUNTER — Encounter: Payer: Self-pay | Admitting: Physical Therapy

## 2014-11-17 DIAGNOSIS — R296 Repeated falls: Secondary | ICD-10-CM

## 2014-11-17 DIAGNOSIS — R2689 Other abnormalities of gait and mobility: Secondary | ICD-10-CM

## 2014-11-17 NOTE — Therapy (Signed)
[place] [facility] MAIN [facility] SERVICES [address] [place], [place], [address] Phone: [phone]   Fax:  [phone]  Physical Therapy Treatment  Patient Details  Name: Fusako Tanabe MRN: [id number] Date of Birth: [birth year] Referring Provider:  Berneice Gandy, MD  Encounter Date: 11/17/2014      PT End of Session - 11/17/14 0906    Visit Number 8   Number of Visits 16   Date for PT Re-Evaluation 11/26/14   PT Start Time 0900   PT Stop Time 0945   PT Time Calculation (min) 45 min   Equipment Utilized During Treatment Gait belt   Activity Tolerance Patient tolerated treatment well   Behavior During Therapy [facility] for tasks assessed/performed      History reviewed. No pertinent past medical history.  History reviewed. No pertinent past surgical history.  There were no vitals filed for this visit.  Visit Diagnosis:  No diagnosis found.      Subjective Assessment - 11/17/14 0903    Subjective Patient is now using the RW when she leaves the house. She is feeling unsteady.    Patient is accompained by: Family member   Limitations Standing   How long can you sit comfortably? Patient reports she has had increased pain in her right foot at the heel since her fall in the bathroom on Friday.    Diagnostic tests Patient is seeing Dr. Cay Schillings after this session for further assessment of her RLE.    Patient Stated Goals Patient wants to improve her balance.   Currently in Pain? Yes   Pain Score 3    Pain Location Knee   Pain Orientation Left   Pain Descriptors / Indicators Sharp   Pain Onset Today   Pain Frequency Occasional        Neuromuscular training including:  Balance beam side stepping, fwd/bwd walking, tandem and sorting balls, head turns left and right, eyes closed, eyes open,  Sit to stand from chair x 10  Standing with ball toss x 2 minutes Patient has loss of balance with head turns and all weight shifting.       outcome measures performed:  6 mwt 750, gait speed .90, TUG 13 sec, 5 x sit to stand 12 sec                        PT Long Term Goals - 11/10/14 1006    PT LONG TERM GOAL #1   Title Patient will increase Berg Balance score by >6 points to demonstrate decreased risk of falling during dynamic functional activities by 11/26/2014   Status On-going   PT LONG TERM GOAL #2   Title Patient will be independent with a home exercise program to improve strength, power, and balance to improve safety and tolerance for dynamic mobility by 11/26/2014.    Status Achieved   PT LONG TERM GOAL #3   Title Patient will complete TUG in under 23 seconds to demonstrate decreased risk of falling during dynamic gait activities by 11/26/2014.    Status On-going   PT LONG TERM GOAL #4   Title Patient will complete 5 time sit to stand with use of hand rails in under 40 seconds to demonstrate decreased risk of falling during dynamic mobility activities by 11/26/2014.   Status On-going   PT LONG TERM GOAL #5   Title Patient will increase FGA score by at least 4 points to demonstrate increased safety with  ambulation and decreased risk of falls by 11/26/2014.   Status On-going   PT LONG TERM GOAL #6   Title Patient will increase independent gait speed by at least 0.25 m/s to demonstrate decreased falls risk and improved safety with community mobility by 11/26/2014.   Status On-going   PT LONG TERM GOAL #7   Title Patient will demonstrate 5/5 LE MMT strength to display increased power and strength for functional mobility by 11/26/2014.   Status On-going               Plan - 11/17/14 0943    Clinical Impression Statement Patient is contuing to have dynamic standing balance deficits.        Problem List There are no active problems to display for this patient.   Ezekiel InaMansfield, Zacary Bauer S 11/17/2014, 9:44 AM  [place] [facility] MA[facility] [address] [place], [place], [address] Phone: [phone]   Fax:  [phone]

## 2014-11-23 ENCOUNTER — Ambulatory Visit: Payer: Medicare Other | Attending: Family Medicine | Admitting: Physical Therapy

## 2014-11-23 ENCOUNTER — Encounter: Payer: Self-pay | Admitting: Physical Therapy

## 2014-11-23 DIAGNOSIS — R296 Repeated falls: Secondary | ICD-10-CM

## 2014-11-23 DIAGNOSIS — R2689 Other abnormalities of gait and mobility: Secondary | ICD-10-CM | POA: Insufficient documentation

## 2014-11-23 NOTE — Therapy (Signed)
[place] [facility] [address] , [place], [address] Phone: [phone]   Fax:  [phone]  Physical Therapy Treatment  Patient Details  Name: April RipperKathleen Ann Rivera MRN: [id number] Date of Birth: 1941/02/19 Referring Provider:  Berneice GandyFowler, Vicki S, MD  Encounter Date: 11/23/2014      PT End of Session - 11/23/14 0917    Visit Number 10   Number of Visits 16   Date for PT Re-Evaluation 11/26/14      History reviewed. No pertinent past medical history.  History reviewed. No pertinent past surgical history.  There were no vitals filed for this visit.  Visit Diagnosis:  Multiple falls  Imbalance      Subjective Assessment - 11/23/14 0917    Subjective Patient is now using the RW when she leaves the house. She is feeling unsteady.    Patient is accompained by: Family member   Limitations Standing   How long can you sit comfortably? Patient reports she has had increased pain in her right foot at the heel since her fall in the bathroom on Friday.    Diagnostic tests Patient is seeing Dr. Cay SchillingsJack Wolf after this session for further assessment of her RLE.    Patient Stated Goals Patient wants to improve her balance.   Pain Onset Today        There-Ex Sit to stand x 4 with chair arms 4 sets min use of hand rails.  Leg Press 75# x 10 repetitions for 2 sets  Standing Heel raises x 15 with unilateral hand support x 2 sets  Step Ups to blue foam pad x 10 repetitions bilaterally    Neuromuscular Re-education  Rhomberg stance on blue foam pad with eyes open x 30 seconds for 2 sets  Stepping on blue foam pad x 30 seconds for 2 sets without hand hold assistance  Dynamic postural balance with rhythmic stabilization 2 sets x 30 seconds (pushing patient forwards, backwards, and laterally)  Catching foam balls on blue foam pad x 30 seconds in Rhomberg stance for 2 sets Walking side stepping on blue foam beam,  fwd/bwds                               PT Long Term Goals - 11/10/14 1006    PT LONG TERM GOAL #1   Title Patient will increase Berg Balance score by >6 points to demonstrate decreased risk of falling during dynamic functional activities by 11/26/2014   Status On-going   PT LONG TERM GOAL #2   Title Patient will be independent with a home exercise program to improve strength, power, and balance to improve safety and tolerance for dynamic mobility by 11/26/2014.    Status Achieved   PT LONG TERM GOAL #3   Title Patient will complete TUG in under 23 seconds to demonstrate decreased risk of falling during dynamic gait activities by 11/26/2014.    Status On-going   PT LONG TERM GOAL #4   Title Patient will complete 5 time sit to stand with use of hand rails in under 40 seconds to demonstrate decreased risk of falling during dynamic mobility activities by 11/26/2014.   Status On-going   PT LONG TERM GOAL #5   Title Patient will increase FGA score by at least 4 points to demonstrate increased safety with ambulation and decreased risk of falls by 11/26/2014.   Status On-going   PT LONG TERM  GOAL #6   Title Patient will increase independent gait speed by at least 0.25 m/s to demonstrate decreased falls risk and improved safety with community mobility by 11/26/2014.   Status On-going   PT LONG TERM GOAL #7   Title Patient will demonstrate 5/5 LE MMT strength to display increased power and strength for functional mobility by 11/26/2014.   Status On-going               Plan - 11/23/14 1610    Clinical Impression Statement Patient is having dynamic standing balance deficits and is unsteady with reaching activities without AD. Pt demonstrated decreased endurance with sit-to-stands exhibiting fatigue towards the end of second set with increased breathing rateContinues to have balance deficits typical with diagnosis. Patient performs intermediate level exercises without pain  behaviors and needs verbal cuing for postural alignment and head positioning.        Problem List There are no active problems to display for this patient.   Ezekiel Ina 11/23/2014, 9:34 AM  [place] [facility] MAIN [facility] SERVICES [address] [place], [place], [address] Phone: [phone]   Fax:  [phone]

## 2014-11-25 ENCOUNTER — Encounter: Payer: Self-pay | Admitting: Physical Therapy

## 2014-11-25 ENCOUNTER — Ambulatory Visit: Payer: Medicare Other | Admitting: Physical Therapy

## 2014-11-25 DIAGNOSIS — R2689 Other abnormalities of gait and mobility: Secondary | ICD-10-CM | POA: Diagnosis not present

## 2014-11-25 DIAGNOSIS — R296 Repeated falls: Secondary | ICD-10-CM

## 2014-11-25 NOTE — Therapy (Signed)
[place] MAIN [facility] SERVICES [address] [place], [place], [address] Phone: [phone]   Fax:  [phone]  Physical Therapy Treatment  Patient Details  Name: April Rivera MRN: [id number] Date of Birth: 08-Jun-1940 Referring Provider:  Chrisandra Carota, MD  Encounter Date: 11/25/2014      PT End of Session - 11/25/14 0906    Visit Number 11   Number of Visits 16   Date for PT Re-Evaluation 11/26/14   PT Start Time 0900   PT Stop Time 0945   PT Time Calculation (min) 45 min   Equipment Utilized During Treatment Gait belt   Activity Tolerance Patient tolerated treatment well   Behavior During Therapy [facility] for tasks assessed/performed      History reviewed. No pertinent past medical history.  History reviewed. No pertinent past surgical history.  There were no vitals filed for this visit.  Visit Diagnosis:  Multiple falls  Imbalance      Subjective Assessment - 11/25/14 0906    Subjective Patient is now using the RW when she leaves the house. She is feeling unsteady.    Patient is accompained by: Family member   Limitations Standing   How long can you sit comfortably? Patient reports she has had increased pain in her right foot at the heel since her fall in the bathroom on Friday.    Diagnostic tests Patient is seeing Dr. Calla Kicks after this session for further assessment of her RLE.    Patient Stated Goals Patient wants to improve her balance.   Pain Onset Today       Out come measures: TUG 14.44 sec 5 x sit to stand: 15.98 sec 10 MW .98 m/sec 68m 1025 feet  Therapeutic exercise including leg press 75 lbs x 20 x 3 sets, ankle pumps 75 lbs 20 x 3 sets VC to eccentrically control leg press and avoid knee hyperextension                          PT Education - 11/25/14 0906    Education provided Yes   Education Details Educated about assistive device safety.   Person(s) Educated Patient   Methods Explanation   Comprehension Verbalized understanding             PT Long Term Goals - 11/25/14 0909    PT LONG TERM GOAL #1   Status Partially Met   PT LONG TERM GOAL #2   Status Partially Met   PT LONG TERM GOAL #3   Status Partially Met   PT LONG TERM GOAL #4   Status Partially Met   PT LONG TERM GOAL #5   Status Partially Met               Plan - 11/25/14 0907    Clinical Impression Statement  Good form was demonstrated throughout eBeltneeded to full ext knees on leg press for increase muscle control.Patient has loss of balance and needs UE support intermittently thorough out exercise   Pt will benefit from skilled therapeutic intervention in order to improve on the following deficits Difficulty walking;Abnormal gait;Decreased activity tolerance;Pain;Decreased balance;Decreased mobility;Decreased strength   Rehab Potential Good   Clinical Impairments Affecting Rehab Potential Deconditioned status, chronic pain state.    PT Frequency 2x / week   PT Duration 8 weeks   PT Treatment/Interventions ADLs/Self Care Home Management;Gait training;Neuromuscular re-education;Stair training;Cryotherapy;Moist Heat;DME Instruction;Balance training;Manual techniques;Therapeutic exercise   PT  Next Visit Plan LE strengthening, narrow BOS, dynamic postural training, power training.           G-Codes - 17-Dec-2014 0910    Functional Assessment Tool Used TUG, 5x sit to stand, FGA, Berg   Functional Limitation Mobility: Walking and moving around   Mobility: Walking and Moving Around Current Status [phone]) At least 40 percent but less than 60 percent impaired, limited or restricted   Mobility: Walking and Moving Around Goal Status [phone]) At least 20 percent but less than 40 percent impaired, limited or restricted      Problem List There are no active problems to display for this patient.   Alanson Puls 2014-12-17, 9:38 AM  [place] MAIN [facility] SERVICES [address] [place], [place], [address] Phone: [phone]   Fax:  [phone]

## 2014-11-30 ENCOUNTER — Ambulatory Visit: Payer: Medicare Other | Admitting: Physical Therapy

## 2014-11-30 ENCOUNTER — Encounter: Payer: Self-pay | Admitting: Physical Therapy

## 2014-11-30 DIAGNOSIS — R2689 Other abnormalities of gait and mobility: Secondary | ICD-10-CM | POA: Diagnosis not present

## 2014-11-30 DIAGNOSIS — R296 Repeated falls: Secondary | ICD-10-CM

## 2014-11-30 NOTE — Therapy (Signed)
[place] MAIN [facility] SERVICES [address] [place], [place], [address] Phone: [phone]   Fax:  [phone]  Physical Therapy Treatment  Patient Details  Name: April Rivera MRN: [id number] Date of Birth: 1941/02/06 Referring Provider:  Chrisandra Carota, MD  Encounter Date: 11/30/2014      PT End of Session - 11/30/14 0914    Visit Number 12   Number of Visits 16   Date for PT Re-Evaluation 11/26/14   PT Start Time 0900   PT Stop Time 0945   PT Time Calculation (min) 45 min   Equipment Utilized During Treatment Gait belt   Activity Tolerance Patient tolerated treatment well   Behavior During Therapy [facility] for tasks assessed/performed      History reviewed. No pertinent past medical history.  History reviewed. No pertinent past surgical history.  There were no vitals filed for this visit.  Visit Diagnosis:  Multiple falls  Imbalance      Subjective Assessment - 11/30/14 0913    Subjective Patient is now using the RW when she leaves the house. She is feeling unsteady.    Patient is accompained by: Family member   Limitations Standing   How long can you sit comfortably? Patient reports she has had increased pain in her right foot at the heel since her fall in the bathroom on Friday.    Diagnostic tests Patient is seeing Dr. Calla Kicks after this session for further assessment of her RLE.    Patient Stated Goals Patient wants to improve her balance.   Currently in Pain? No/denies   Pain Onset Today        Neuromuscular Re-education  Rhomberg stance on blue foam pad with eyes open x 30 seconds for 2 sets  Stepping on blue foam pad x 30 seconds for 2 sets without hand hold assistance  Dynamic postural balance with rhythmic stabilization 2 sets x 30 seconds (pushing patient forwards, backwards, and laterally)  Catching foam balls on blue foam pad x 30 seconds in Rhomberg stance for 2 sets. Ambulation with head turns  left and right, stopping and quick turns Tandem standing with head turns Standing with tapping on cones x 10 bilaterally                          PT Education - 11/30/14 0913    Education provided Yes   Person(s) Educated Patient   Methods Explanation   Comprehension Verbalized understanding             PT Long Term Goals - 11/25/14 0909    PT LONG TERM GOAL #1   Status Partially Met   PT LONG TERM GOAL #2   Status Partially Met   PT LONG TERM GOAL #3   Status Partially Met   PT LONG TERM GOAL #4   Status Partially Met   PT LONG TERM GOAL #5   Status Partially Met               Plan - 11/30/14 0915    Clinical Impression Statement Encouraged to keep upright posture with squats.   Pt will benefit from skilled therapeutic intervention in order to improve on the following deficits Difficulty walking;Abnormal gait;Decreased activity tolerance;Pain;Decreased balance;Decreased mobility;Decreased strength   Rehab Potential Good   Clinical Impairments Affecting Rehab Potential Deconditioned status, chronic pain state.    PT Frequency 2x / week   PT Duration 8 weeks  PT Treatment/Interventions ADLs/Self Care Home Management;Gait training;Neuromuscular re-education;Stair training;Cryotherapy;Moist Heat;DME Instruction;Balance training;Manual techniques;Therapeutic exercise   PT Next Visit Plan LE strengthening, narrow BOS, dynamic postural training, power training.    PT Home Exercise Plan Maintained from last session.         Problem List There are no active problems to display for this patient.   Alanson Puls 11/30/2014, 9:45 AM  [place] MAIN [facility] SERVICES [address] [place], [place], [address] Phone: [phone]   Fax:  [phone]

## 2014-12-02 ENCOUNTER — Ambulatory Visit: Payer: Medicare Other | Admitting: Physical Therapy

## 2014-12-02 ENCOUNTER — Encounter: Payer: Self-pay | Admitting: Physical Therapy

## 2014-12-02 DIAGNOSIS — R2689 Other abnormalities of gait and mobility: Secondary | ICD-10-CM

## 2014-12-02 DIAGNOSIS — R296 Repeated falls: Secondary | ICD-10-CM

## 2014-12-02 NOTE — Therapy (Signed)
[place] MAIN [facility] SERVICES [address] [place], [place], [address] Phone: [phone]   Fax:  [phone]  Physical Therapy Treatment  Patient Details  Name: April Rivera MRN: [id number] Date of Birth: [birth year] Referring Provider:  Chrisandra Carota, MD  Encounter Date: 12/02/2014      PT End of Session - 12/02/14 0911    Visit Number 13   Number of Visits 16   Date for PT Re-Evaluation 11/26/14   PT Start Time 0900   PT Stop Time 0945   PT Time Calculation (min) 45 min   Equipment Utilized During Treatment Gait belt   Activity Tolerance Patient tolerated treatment well   Behavior During Therapy [facility] for tasks assessed/performed      History reviewed. No pertinent past medical history.  History reviewed. No pertinent past surgical history.  There were no vitals filed for this visit.  Visit Diagnosis:  Multiple falls  Imbalance      Subjective Assessment - 12/02/14 0910    Subjective Patient is now using the RW when she leaves the house. She is feeling unsteady.    Patient is accompained by: Family member   Limitations Standing   How long can you sit comfortably? Patient reports she has had increased pain in her right foot at the heel since her fall in the bathroom on Friday.    Diagnostic tests Patient is seeing Dr. Calla Kicks after this session for further assessment of her RLE.    Patient Stated Goals Patient wants to improve her balance.   Currently in Pain? No/denies   Pain Onset Today            Neuromuscular training: Fwd/bwd stepping on blue foam, side stepping left and right Tapping from blue foam to stool and step ups from blue foam to stool Step ups from floor to stool left and right Gait training without use of UE in parallel bars 20 feet x 10 without loss of balance Head turns with tandem standing Head turns with feet together Gait training with head turns left and right Leg press x 20 x 2  with 75 lbs. 4 way hip flex x 20 x 2 Verbal cues needed to keep hips aligned with abduction.                      PT Education - 12/02/14 0910    Education provided Yes   Education Details progressed HEP   Person(s) Educated Patient   Comprehension Verbalized understanding             PT Long Term Goals - 11/25/14 0909    PT LONG TERM GOAL #1   Status Partially Met   PT LONG TERM GOAL #2   Status Partially Met   PT LONG TERM GOAL #3   Status Partially Met   PT LONG TERM GOAL #4   Status Partially Met   PT LONG TERM GOAL #5   Status Partially Met               Plan - 12/02/14 0916    Clinical Impression Statement Fatigue with sit to stand but demonstrating more control, Increase weight for standing exercises,multiple standing balance deficits with dynamic standing challenges.    Pt will benefit from skilled therapeutic intervention in order to improve on the following deficits Difficulty walking;Abnormal gait;Decreased activity tolerance;Pain;Decreased balance;Decreased mobility;Decreased strength   Rehab Potential Good   Clinical Impairments Affecting Rehab Potential Deconditioned  status, chronic pain state.    PT Frequency 2x / week   PT Duration 8 weeks   PT Treatment/Interventions ADLs/Self Care Home Management;Gait training;Neuromuscular re-education;Stair training;Cryotherapy;Moist Heat;DME Instruction;Balance training;Manual techniques;Therapeutic exercise   PT Next Visit Plan LE strengthening, narrow BOS, dynamic postural training, power training.    PT Home Exercise Plan Maintained from last session.         Problem List There are no active problems to display for this patient.   Alanson Puls 12/02/2014, 9:25 AM  [place] MAIN [facility] SERVICES [address] [place], [place], [address] Phone: [phone]   Fax:  [phone]

## 2015-03-22 DEATH — deceased

## 2015-12-11 IMAGING — CR DG LUMBAR SPINE 2-3V
3 series · 3 of 3 positions shown · non-contrast
Comparison: None

CLINICAL DATA: Fell today injuring lower back, LEFT posterior
sacral area pain, pain into LEFT hip

EXAM:
LUMBAR SPINE - 2-3 VIEW

[l-spine ap]
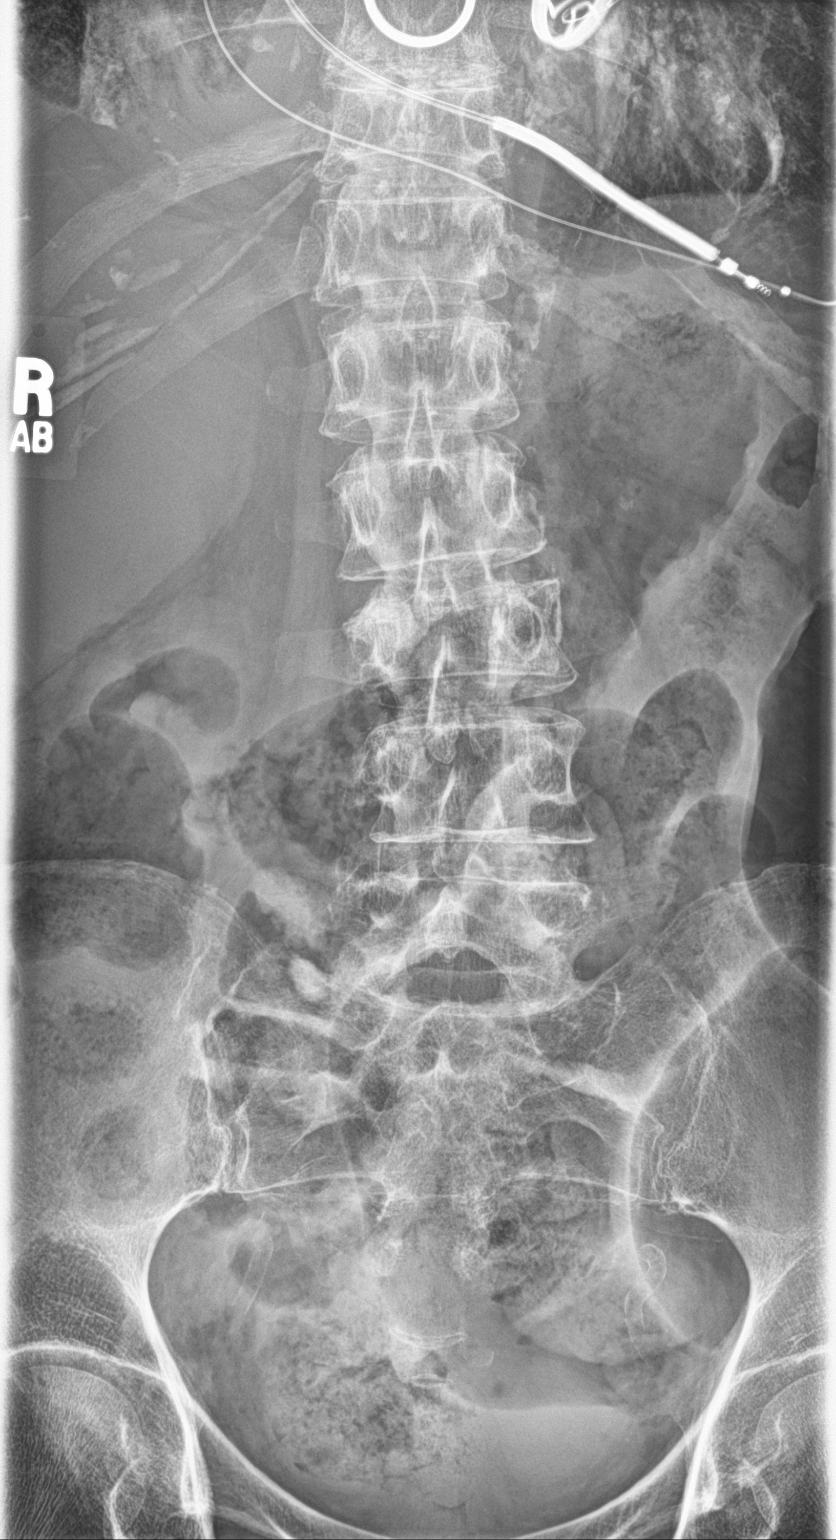

[l-spine lat]
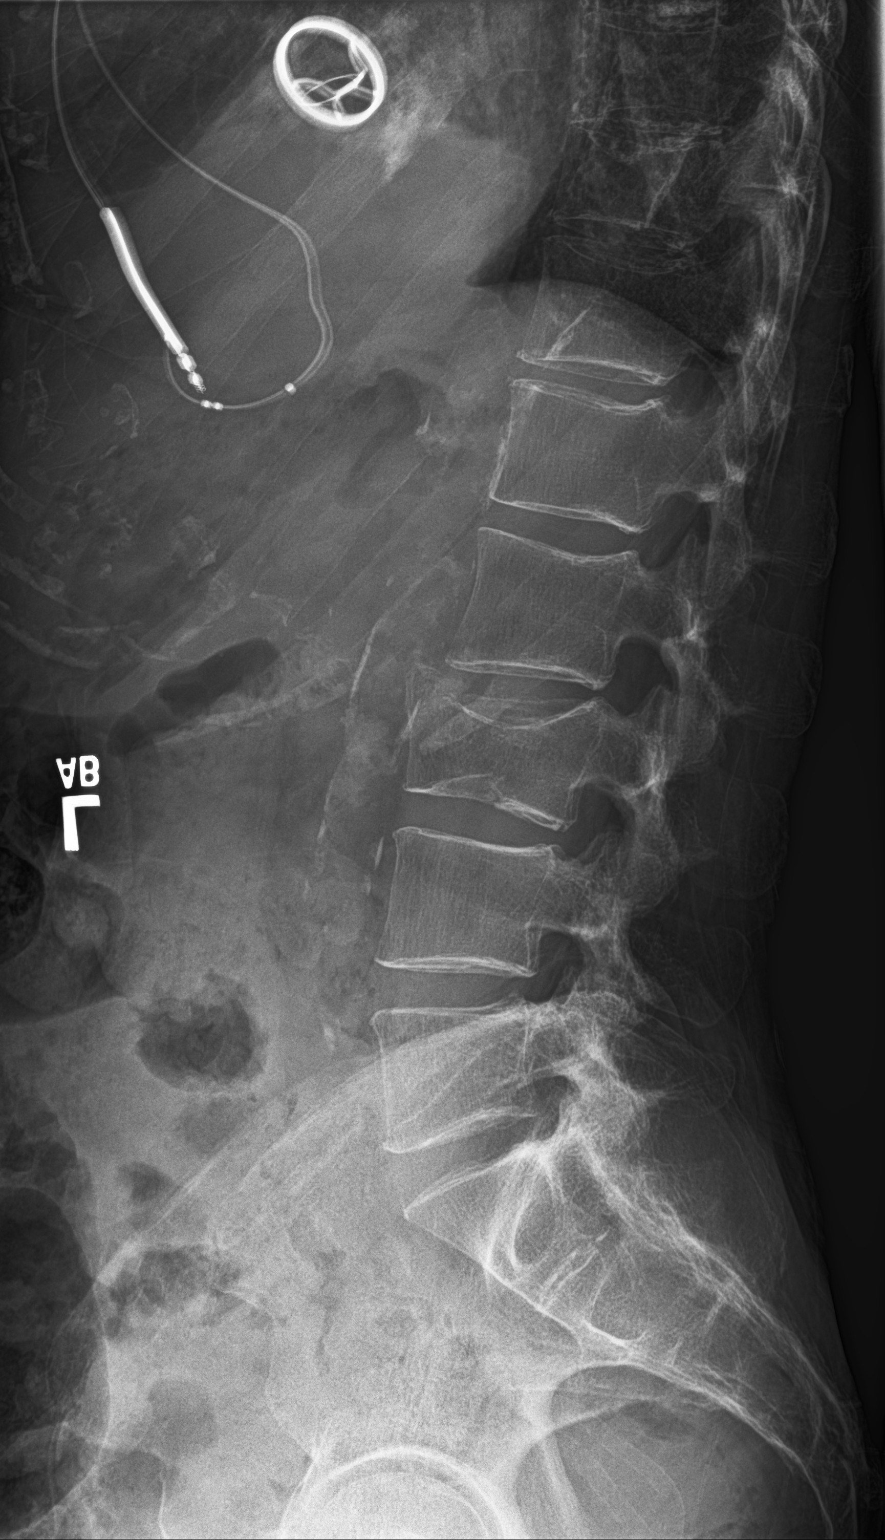

[l-spine spot]
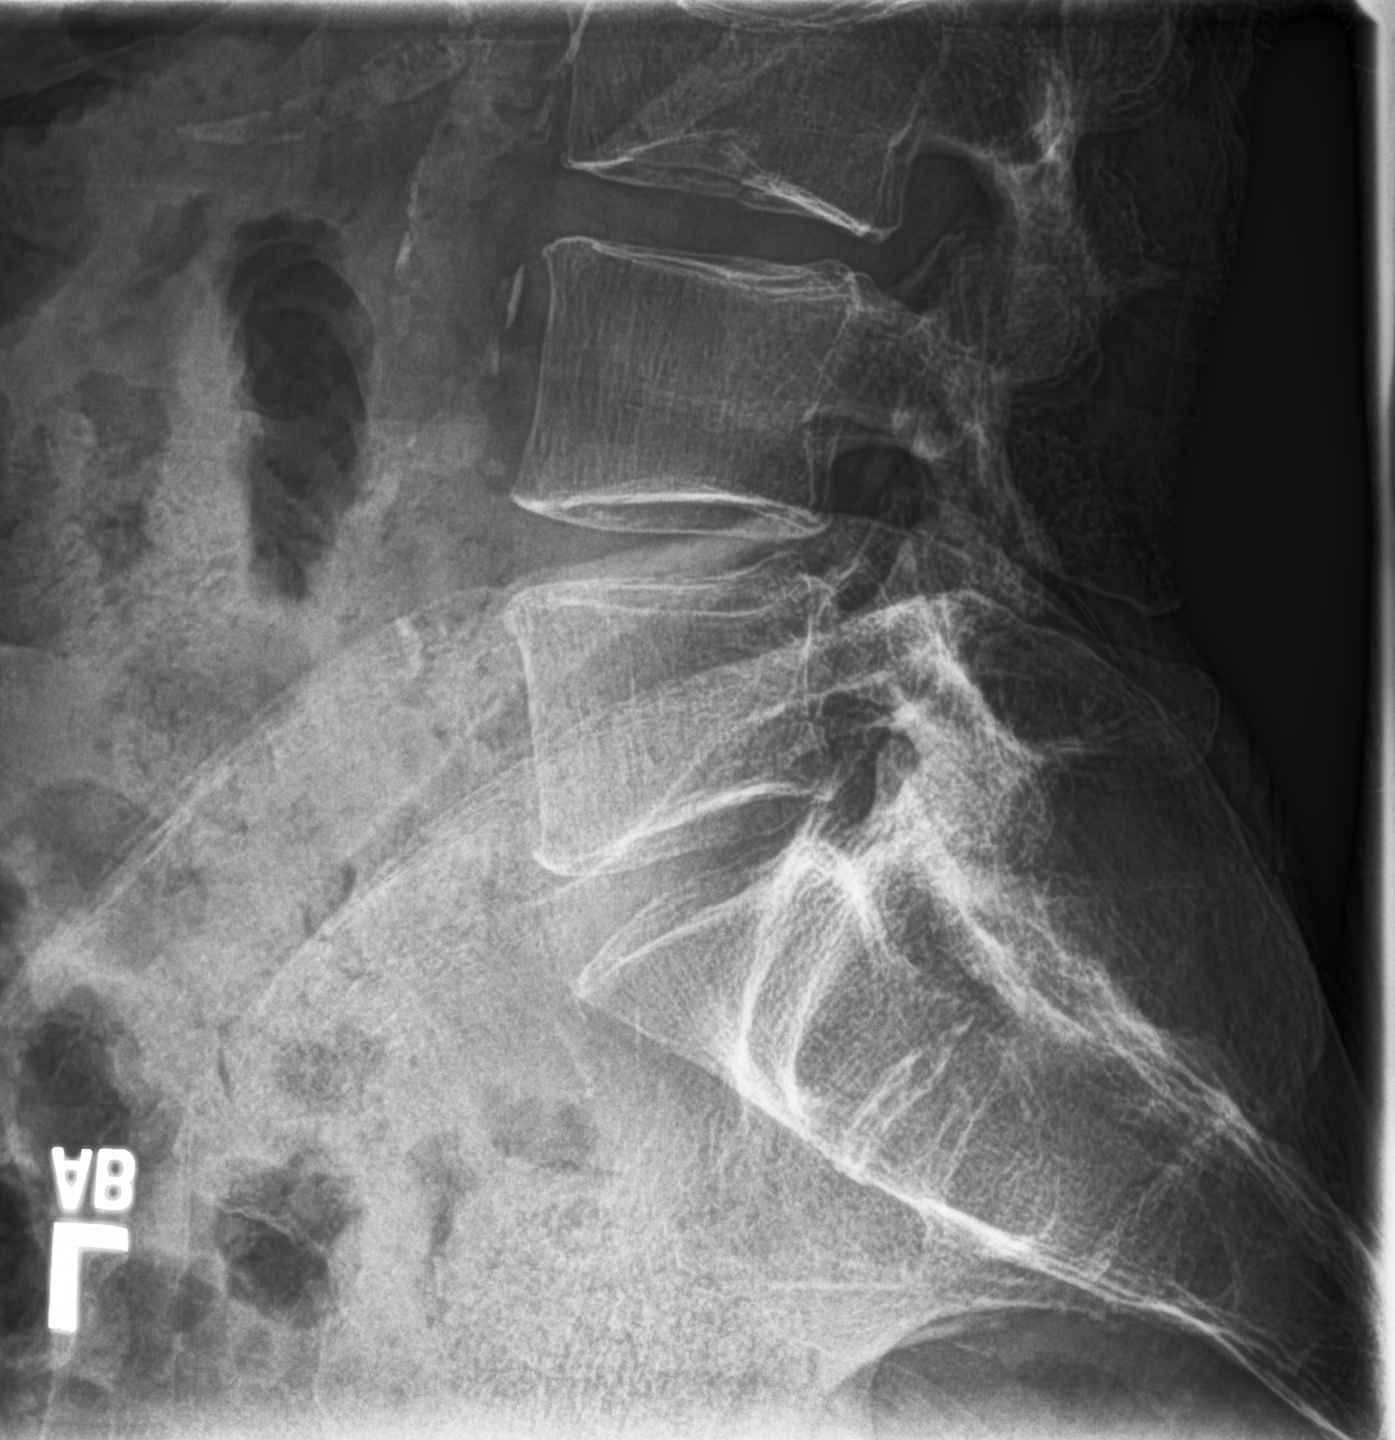

[3 of 3 positions shown; findings below may reference images not displayed]

FINDINGS: Osseous demineralization.

Five non-rib-bearing lumbar type vertebrae.

Minimal levoconvex lumbar scoliosis.

Vertebral body fracture of L3 with anterior displacement of a
dominant anterior fragment.

Anterior height is fairly well maintained though central compression
of the vertebrae is noted.

No definite retropulsion.

SI joints preserved.

No additional fracture or subluxation.

Atherosclerotic calcifications noted.
IMPRESSION: L3 vertebral body fracture.

## 2016-02-17 IMAGING — CR RIGHT RIBS AND CHEST - 3+ VIEW
3 series · 3 of 3 positions shown · non-contrast
Comparison: None.

CLINICAL DATA: Fall last evening, right chest injury, bruising,
pain

EXAM:
RIGHT RIBS AND CHEST - 3+ VIEW

[chest pa]
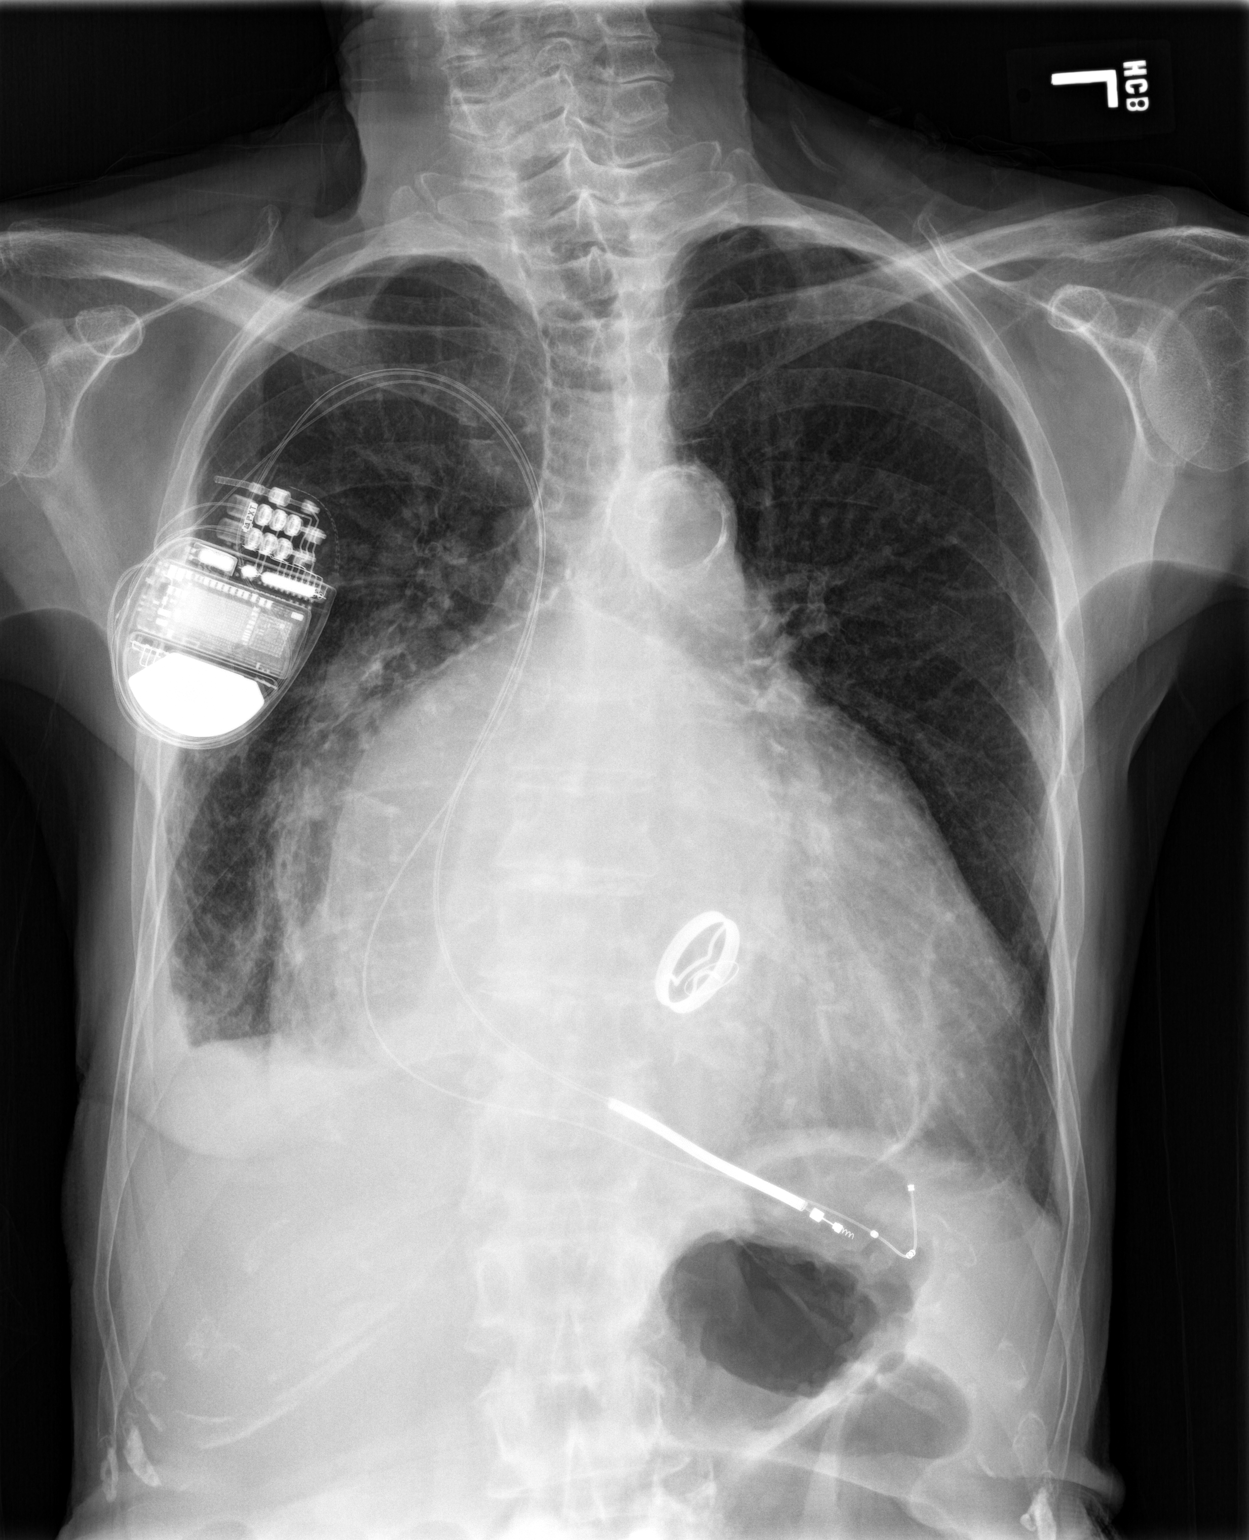

[rib pa]
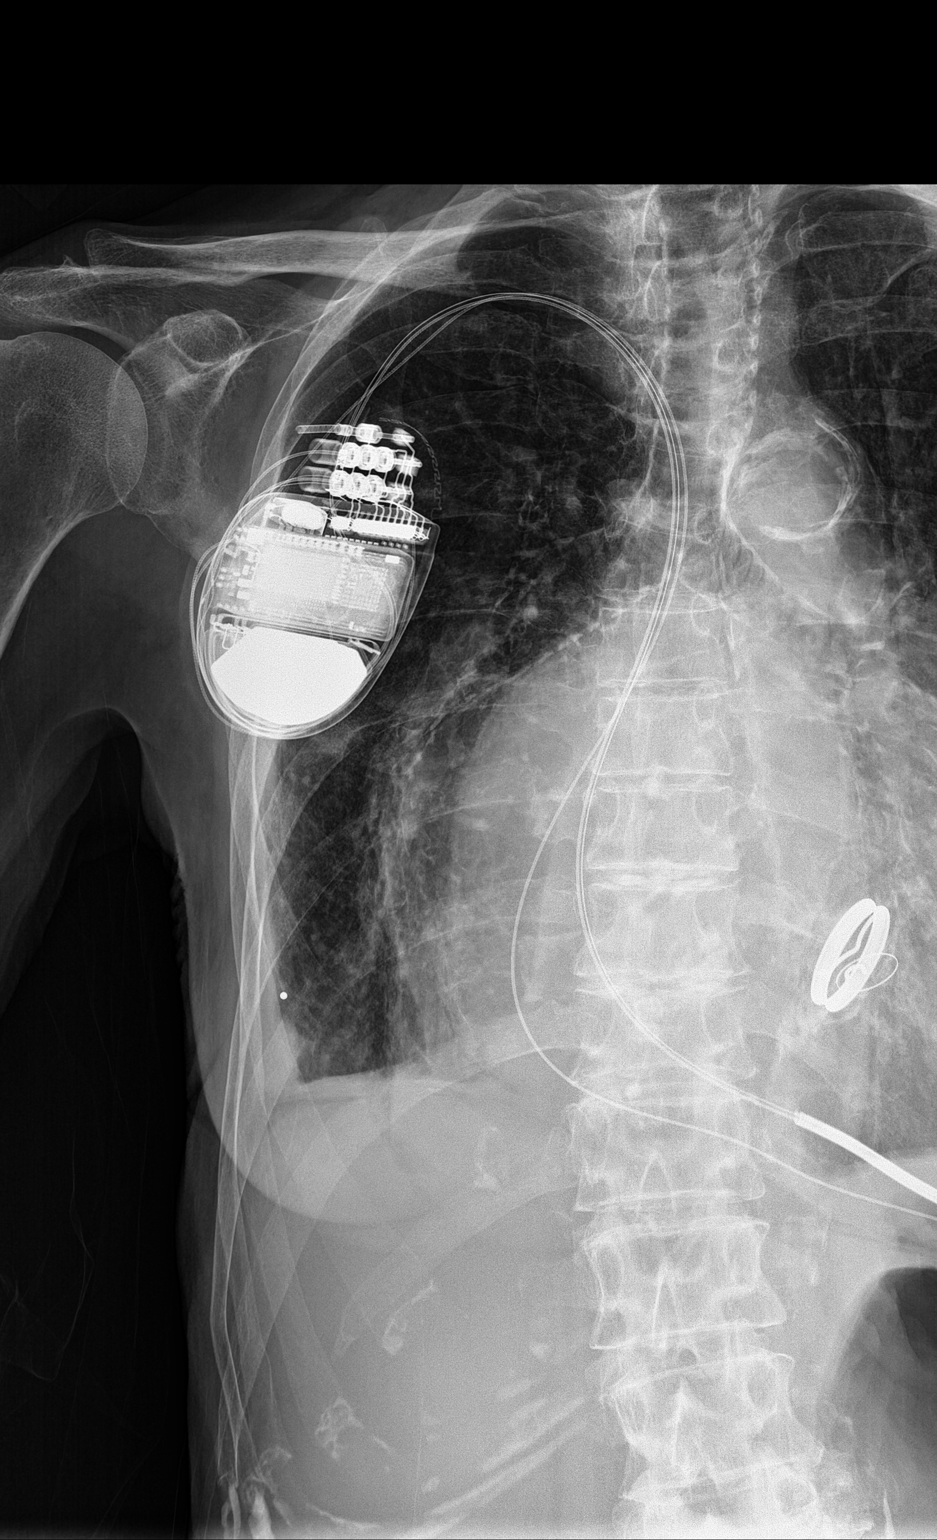

[rib ap]
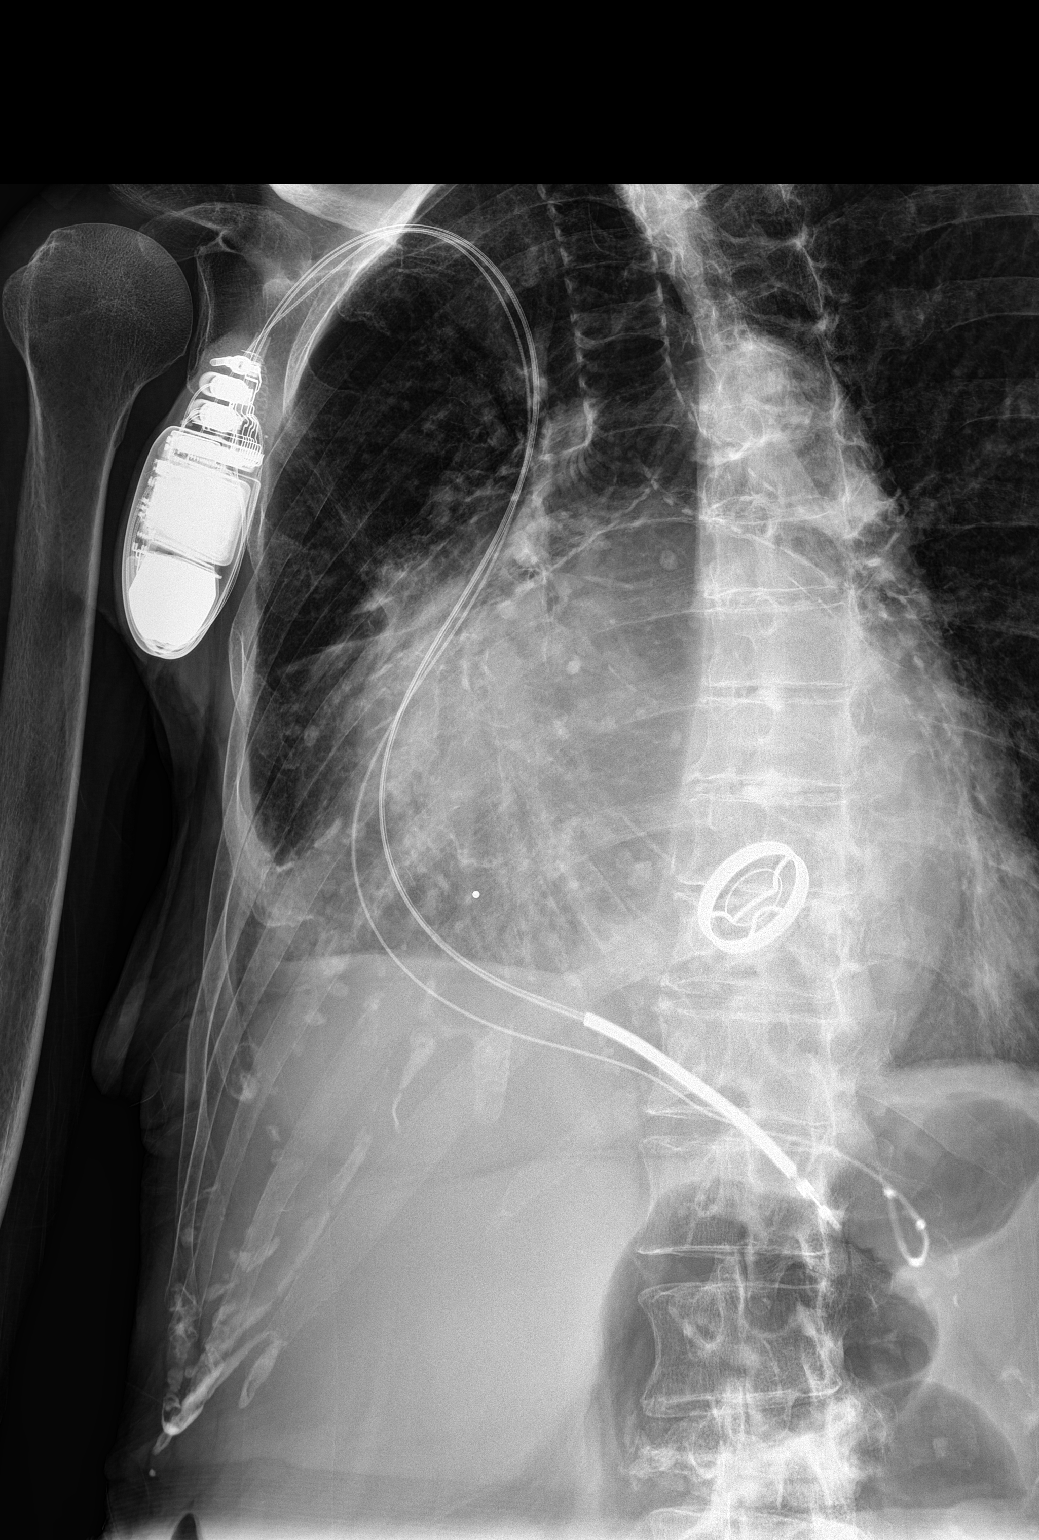

[3 of 3 positions shown; findings below may reference images not displayed]

FINDINGS: Massive cardiomegaly with prosthetic heart valve noted. Left
subclavian AICD/ pacer evident. No current CHF or edema. Chronic
basilar atelectasis and scarring. Trace right pleural effusion
noted. Atherosclerosis of the aorta. No pneumothorax evident. Healed
left rib fractures noted.

No definite acute right rib abnormality or displaced fracture.
IMPRESSION: Massive cardiomegaly without current CHF

Basilar atelectasis versus scarring and trace right effusion.

No pneumothorax or acute displaced rib fracture.
